# Patient Record
Sex: Male | Born: 1990 | Race: White | Hispanic: No | Marital: Married | Smoking: Never smoker
Health system: Southern US, Community
[De-identification: ages and names within clinical notes are randomized; demographics above are authoritative.]

## PROBLEM LIST (undated history)

## (undated) HISTORY — PX: TONSILLECTOMY: SUR1361

## (undated) HISTORY — PX: SURGERY SCROTAL / TESTICULAR: SUR1316

---

## 2019-02-06 ENCOUNTER — Emergency Department (HOSPITAL_COMMUNITY)
Admission: EM | Admit: 2019-02-06 | Discharge: 2019-02-06 | Disposition: A | Discharge status: Home / Self Care | Payer: BC Managed Care – PPO | Attending: Emergency Medicine | Admitting: Emergency Medicine

## 2019-02-06 ENCOUNTER — Ambulatory Visit (INDEPENDENT_AMBULATORY_CARE_PROVIDER_SITE_OTHER)
Admission: EM | Admit: 2019-02-06 | Discharge: 2019-02-06 | Disposition: A | Discharge status: Home / Self Care | Payer: BC Managed Care – PPO | Source: Home / Self Care | Attending: Internal Medicine | Admitting: Internal Medicine

## 2019-02-06 ENCOUNTER — Emergency Department (HOSPITAL_COMMUNITY): Payer: BC Managed Care – PPO

## 2019-02-06 ENCOUNTER — Encounter (HOSPITAL_COMMUNITY): Payer: Self-pay

## 2019-02-06 ENCOUNTER — Other Ambulatory Visit: Payer: Self-pay

## 2019-02-06 ENCOUNTER — Encounter (HOSPITAL_COMMUNITY): Payer: Self-pay | Admitting: Emergency Medicine

## 2019-02-06 ENCOUNTER — Inpatient Hospital Stay (HOSPITAL_COMMUNITY): Admit: 2019-02-06 | Payer: BC Managed Care – PPO

## 2019-02-06 DIAGNOSIS — N50812 Left testicular pain: Secondary | ICD-10-CM | POA: Diagnosis not present

## 2019-02-06 DIAGNOSIS — N50819 Testicular pain, unspecified: Secondary | ICD-10-CM

## 2019-02-06 LAB — POCT URINALYSIS DIP (DEVICE)
Bilirubin Urine: NEGATIVE
Glucose, UA: NEGATIVE mg/dL
Hgb urine dipstick: NEGATIVE
Ketones, ur: NEGATIVE mg/dL
Leukocytes,Ua: NEGATIVE
Nitrite: NEGATIVE
Protein, ur: NEGATIVE mg/dL
Specific Gravity, Urine: 1.025 (ref 1.005–1.030)
Urobilinogen, UA: 0.2 mg/dL (ref 0.0–1.0)
pH: 5 (ref 5.0–8.0)

## 2019-02-06 NOTE — ED Provider Notes (Signed)
10:31 PM handoff from [facility] at shift change.  Patient from urgent care with complaint of left testicular pain.  Symptoms rule out torsion.  Ultrasound is reassuring without any acute abnormalities.  Patient updated.  He states that overall the pain is gradually improving.  He seems relieved that he is not having testicular torsion.  He agrees to continue supportive measures at home including Tylenol, NSAIDs, scrotal support.  Urology information given in case patient has pain is not improving.    Carlisle Cater, PA-C 02/06/19 2232    Dorie Rank, MD 02/08/19 [phone]

## 2019-02-06 NOTE — Discharge Instructions (Signed)
Your ultrasound today did not show any serious problems with the testicle or scrotum.  No evidence of torsion, swelling or infection, varicose veins.  Please continue over-the-counter medications such as Tylenol or Motrin as needed for pain.  Follow-up with your primary care doctor or the urologist listed as needed if symptoms do not improve.

## 2019-02-06 NOTE — ED Notes (Signed)
Patient verbalizes understanding of discharge instructions. Opportunity for questioning and answers were provided. Armband removed by staff, pt discharged from ED.  

## 2019-02-06 NOTE — ED Provider Notes (Signed)
[facility]    CSN: [id number] Arrival date & [id number]: 02/06/19  1810     History   Chief Complaint Chief Complaint  Patient presents with   Testicle Pain    HPI Lawrence Armstrong is a 28 y.o. male with no past medical history comes to urgent care with complaints of sudden onset left testicular pain in the early hours of today.  Patient says he was asleep when the pain started.  Pain was severe, 10/10 and sharp in nature.  Pain was nonradiating into the groin or lower abdomen.  No known relieving factors.  Pain is improved and is currently 2 out of 10.  No dysuria, frequency or urgency.  No genital ulcerations.Marland Kitchen.   HPI  History reviewed. No pertinent past medical history.  There are no active problems to display for this patient.   History reviewed. No pertinent surgical history.     Home Medications    Prior to Admission medications   Not on File    Family History History reviewed. No pertinent family history.  Social History Social History   Tobacco Use   Smoking status: Never Smoker   Smokeless tobacco: Never Used  Substance Use Topics   Alcohol use: Yes    Comment: casual    Drug use: Never     Allergies   Patient has no known allergies.   Review of Systems Review of Systems  Constitutional: Negative for activity change and chills.  HENT: Negative.   Gastrointestinal: Negative.  Negative for nausea and vomiting.  Genitourinary: Positive for testicular pain. Negative for discharge, dysuria, flank pain, frequency, genital sores, penile pain, penile swelling, scrotal swelling and urgency.  Musculoskeletal: Negative.   Skin: Negative for rash and wound.  Neurological: Negative for dizziness, weakness, light-headedness and headaches.     Physical Exam Triage Vital Signs ED Triage Vitals  Enc Vitals Group     BP 02/06/19 1847 113/81     Pulse Rate 02/06/19 1847 66     Resp 02/06/19 1847 16     Temp 02/06/19 1847 98.5 F (36.9 C)     Temp Source 02/06/19 1847 Temporal     SpO2 02/06/19 1847 100 %     Weight --      Height --      Head Circumference --      Peak Flow --      Pain Score 02/06/19 1850 4     Pain Loc --      Pain Edu? --      Excl. in GC? --    No data found.  Updated Vital Signs BP 113/81 (BP Location: Left Arm)    Pulse 66    Temp 98.5 F (36.9 C) (Temporal)    Resp 16    SpO2 100%   Visual Acuity Right Eye Distance:   Left Eye Distance:   Bilateral Distance:    Right Eye Near:   Left Eye Near:    Bilateral Near:     Physical Exam Constitutional:      General: He is not in acute distress.    Appearance: He is not ill-appearing.  Cardiovascular:     Rate and Rhythm: Normal rate and regular rhythm.     Pulses: Normal pulses.     Heart sounds: Normal heart sounds.  Pulmonary:     Effort: Pulmonary effort is normal.     Breath sounds: Normal breath sounds.  Abdominal:     General: Bowel sounds are normal.  There is no distension.     Palpations: Abdomen is soft.     Tenderness: There is no abdominal tenderness. There is no guarding.     Hernia: No hernia is present.  Genitourinary:    Penis: Normal.      Comments: Left testicular tenderness over the epididymis.  No masses palpated in the testis bilaterally. Musculoskeletal: Normal range of motion.        General: No swelling or deformity.  Skin:    General: Skin is warm.     Capillary Refill: Capillary refill takes less than 2 seconds.     Coloration: Skin is not jaundiced.     Findings: No bruising or lesion.  Neurological:     General: No focal deficit present.     Mental Status: He is alert and oriented to person, place, and time.      UC Treatments / Results  Labs (all labs ordered are listed, but only abnormal results are displayed) Labs Reviewed  POCT URINALYSIS DIP (DEVICE)    EKG   Radiology US Scrotum W/doppler  Result Date: 02/06/2019 CLINICAL DATA:  28 year old male with left testicular pain. EXAM:  SCROTAL ULTRASOUND DOPPLER ULTRASOUND OF THE TESTICLES TECHNIQUE: Complete ultrasound examination of the testicles, epididymis, and other scrotal structures was performed. Color and spectral Doppler ultrasound were also utilized to evaluate blood flow to the testicles. COMPARISON:  None. FINDINGS: Right testicle Measurements: 3.8 x 1.7 x 3.0 cm for a volume of 10 cc. No mass or microlithiasis visualized. Left testicle Measurements: 4.4 x 2.0 x 3.0 cm for a volume of 13.5 cc. No mass or microlithiasis visualized. Right epididymis: Normal in size and appearance. There is a 5 mm right epididymal head cyst. Left epididymis:  Normal in size and appearance. Hydrocele:  None visualized. Varicocele:  None visualized. Pulsed Doppler interrogation of both testes demonstrates normal low resistance arterial and venous waveforms bilaterally. IMPRESSION: Unremarkable testicular ultrasound. Electronically Signed   By: Anner Crete M.D.   On: 02/06/2019 21:47    Procedures Procedures (including critical care time)  Medications Ordered in UC Medications - No data to display  Initial Impression / Assessment and Plan / UC Course  I have reviewed the triage vital signs and the nursing notes.  Pertinent labs & imaging results that were available during my care of the patient were reviewed by me and considered in my medical decision making (see chart for details).     1.  Left testicular pain, possible torsion: Ultrasound of the scrotum with Dopplers Over-the-counter pain medications If patient's symptoms recur he is advised to return to urgent care for reevaluation Urinalysis was unremarkable Final Clinical Impressions(s) / UC Diagnoses   Final diagnoses:  Testicle pain  Testicular pain, left   Discharge Instructions   None    ED Prescriptions    None     Controlled Substance Prescriptions [place] Controlled Substance Registry consulted? No   Chase Picket, MD 02/08/19 1426

## 2019-02-06 NOTE — ED Notes (Signed)
Called and left message with vascular to get scheduled for tomorrow

## 2019-02-06 NOTE — ED Provider Notes (Signed)
[name]   CSN: [id number] Arrival date & time: 02/06/19  1947    History   Chief Complaint Chief Complaint  Patient presents with  . Testicle Pain    HPI Lawrence Armstrong is a 28 y.o. male.     28 year old male presents with complaint of pain in his left testicle.  Patient states pain onset around 1 AM, states he was awake at that time.  Patient describes pain as sharp and intermittent at onset.  Patient reports ongoing pain at this time but not as severe as it was earlier.  Denies dysuria, urethral discharge, changes in bowel or bladder habits.  Patient is sexually active with his fiance, no new partners or concerns for STDs.     History reviewed. No pertinent past medical history.  There are no active problems to display for this patient.   History reviewed. No pertinent surgical history.      Home Medications    Prior to Admission medications   Not on File    Family History No family history on file.  Social History Social History   Tobacco Use  . Smoking status: Never Smoker  . Smokeless tobacco: Never Used  Substance Use Topics  . Alcohol use: Yes    Comment: casual   . Drug use: Never     Allergies   Patient has no known allergies.   Review of Systems Review of Systems  Constitutional: Negative for fever.  Gastrointestinal: Negative for abdominal pain, nausea and vomiting.  Genitourinary: Positive for testicular pain. Negative for discharge, dysuria, frequency, penile pain, penile swelling, scrotal swelling and urgency.  Skin: Negative for color change, rash and wound.  Allergic/Immunologic: Negative for immunocompromised state.  Neurological: Negative for weakness.  Psychiatric/Behavioral: Negative for confusion.  All other systems reviewed and are negative.    Physical Exam Updated Vital Signs There were no vitals taken for this visit.  Physical Exam Vitals signs and  nursing note reviewed. Exam conducted with a chaperone present.  Constitutional:      General: He is not in acute distress.    Appearance: He is well-developed. He is not diaphoretic.  HENT:     Head: Normocephalic and atraumatic.  Pulmonary:     Effort: Pulmonary effort is normal.  Abdominal:     Tenderness: There is no abdominal tenderness.  Genitourinary:    Penis: Normal and circumcised.      Scrotum/Testes:        Right: Mass, tenderness, swelling, testicular hydrocele or varicocele not present.        Left: Tenderness and varicocele present. Mass or swelling not present.     Comments: Question varicocele to left testicle, mild tenderness  Skin:    General: Skin is warm and dry.     Findings: No erythema or rash.  Neurological:     Mental Status: He is alert and oriented to person, place, and time.  Psychiatric:        Behavior: Behavior normal.      ED Treatments / Results  Labs (all labs ordered are listed, but only abnormal results are displayed) Labs Reviewed - No data to display  EKG None  Radiology No results found.  Procedures Procedures (including critical care time)  Medications Ordered in ED Medications - No data to display   Initial Impression / Assessment and Plan / ED Course  I have reviewed the triage vital signs and the nursing notes.  Pertinent labs & imaging  results that were available during my care of the patient were reviewed by me and considered in my medical decision making (see chart for details).  Clinical Course as of Feb 05 2038  Mon Feb 05, 8261  948 28 year old male with left testicular pain, sent by urgent care.  On exam has mild tenderness left testicle question varicocele.  Care signed out to [facility], PA-C, awaiting [place] results.    [LM]    Clinical Course User Index [LM] Tacy Learn, PA-C      Final Clinical Impressions(s) / ED Diagnoses   Final diagnoses:  Testicular pain    ED Discharge Orders    None        Roque Lias 02/06/19 2039    Dorie Rank, MD 02/08/19 [phone]

## 2019-02-06 NOTE — ED Triage Notes (Signed)
Pt here for left testicle pain starting last night that is improved today but not resolved

## 2019-02-06 NOTE — ED Triage Notes (Signed)
Pt from home w/ a c/o left testicular pain that began last night. He reports several episodes of intense pain, and a current complaint of discomfort. 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 No recent injuries or illnesses. No N/V/D. No dysuria or discharges noted.

## 2020-12-27 ENCOUNTER — Telehealth (HOSPITAL_COMMUNITY): Payer: Self-pay | Admitting: Physician Assistant

## 2020-12-27 ENCOUNTER — Other Ambulatory Visit: Payer: Self-pay

## 2020-12-27 ENCOUNTER — Ambulatory Visit (HOSPITAL_COMMUNITY)
Admission: EM | Admit: 2020-12-27 | Discharge: 2020-12-27 | Disposition: A | Discharge status: Home / Self Care | Payer: BC Managed Care – PPO | Attending: Physician Assistant | Admitting: Physician Assistant

## 2020-12-27 ENCOUNTER — Encounter (HOSPITAL_COMMUNITY): Payer: Self-pay | Admitting: Emergency Medicine

## 2020-12-27 DIAGNOSIS — M79645 Pain in left finger(s): Secondary | ICD-10-CM | POA: Diagnosis not present

## 2020-12-27 DIAGNOSIS — M7989 Other specified soft tissue disorders: Secondary | ICD-10-CM | POA: Diagnosis not present

## 2020-12-27 DIAGNOSIS — Z23 Encounter for immunization: Secondary | ICD-10-CM | POA: Diagnosis not present

## 2020-12-27 DIAGNOSIS — W5501XA Bitten by cat, initial encounter: Secondary | ICD-10-CM

## 2020-12-27 MED ORDER — AMOXICILLIN-POT CLAVULANATE 875-125 MG PO TABS: 1.0000 | ORAL_TABLET | Freq: Two times a day (BID) | ORAL | 0 refills | Status: AC

## 2020-12-27 MED ORDER — TETANUS-DIPHTH-ACELL PERTUSSIS 5-2.5-18.5 LF-MCG/0.5 IM SUSY
0.5000 mL | PREFILLED_SYRINGE | Freq: Once | INTRAMUSCULAR | Status: AC
  Administered 2020-12-27: 0.5 mL via INTRAMUSCULAR

## 2020-12-27 MED ORDER — AMOXICILLIN-POT CLAVULANATE 875-125 MG PO TABS: 1.0000 | ORAL_TABLET | Freq: Two times a day (BID) | ORAL | 0 refills | Status: DC

## 2020-12-27 MED ORDER — TETANUS-DIPHTH-ACELL PERTUSSIS 5-2.5-18.5 LF-MCG/0.5 IM SUSY
PREFILLED_SYRINGE | INTRAMUSCULAR | Status: AC
  Filled 2020-12-27: qty 0.5

## 2020-12-27 NOTE — Discharge Instructions (Addendum)
Your tetanus was updated today.  Please start Augmentin twice daily for 10 days.  This can upset your stomach so take it with food.  If you develop any worsening symptoms including increased swelling, difficulty moving finger, numbness, tingling sensation you need to be reevaluated.

## 2020-12-27 NOTE — ED Triage Notes (Signed)
PT was bit by his own cat today. Cat is up to date on rabies vaccines.   Last TDAP was 10+ years ago.

## 2020-12-27 NOTE — ED Provider Notes (Signed)
[facility]    CSN: [id number] Arrival date & time: 12/27/20  1712      History   Chief Complaint Chief Complaint  Patient presents with   Animal Bite    Cat bite to left pointer finger    HPI Lawrence Armstrong is a 30 y.o. male.   Patient presents today with a several hour history of left finger pain and swelling following animal bite.  Reports that he was trying to contain his cat when the cat bit his left pointer finger.  Patient reports pain is rated 2/3 on a 0-10 pain scale, localized to left proximal index finger, described as throbbing, no aggravating relieving factors identified.  He denies any numbness or paresthesias.  Reports that the cat is up-to-date on all vaccinations including rabies.  Patient is unsure when his last tetanus was.  He did clean affected area with warm water and soap prior to coming to clinic.  He denies any active bleeding or drainage.   History reviewed. No pertinent past medical history.  There are no problems to display for this patient.   History reviewed. No pertinent surgical history.     Home Medications    Prior to Admission medications   Medication Sig Start Date End Date Taking? Authorizing Provider  amoxicillin-clavulanate (AUGMENTIN) 875-125 MG tablet Take 1 tablet by mouth every 12 (twelve) hours for 10 days. 12/27/20 01/06/21 Yes Edwar Coe, Noberto Retort, PA-C    Family History History reviewed. No pertinent family history.  Social History Social History   Tobacco Use   Smoking status: Never   Smokeless tobacco: Never  Vaping Use   Vaping Use: Some days  Substance Use Topics   Alcohol use: Yes    Comment: casual    Drug use: Never     Allergies   Patient has no known allergies.   Review of Systems Review of Systems  Constitutional:  Negative for activity change, appetite change, fatigue and fever.  Respiratory:  Negative for cough and shortness of breath.   Cardiovascular:  Negative for chest pain.   Gastrointestinal:  Negative for abdominal pain, diarrhea, nausea and vomiting.  Musculoskeletal:  Positive for arthralgias and joint swelling.  Skin:  Positive for wound. Negative for color change.  Neurological:  Negative for dizziness, weakness, light-headedness, numbness and headaches.    Physical Exam Triage Vital Signs ED Triage Vitals  Enc Vitals Group     BP 12/27/20 1812 133/82     Pulse Rate 12/27/20 1812 69     Resp 12/27/20 1812 16     Temp 12/27/20 1812 98.8 F (37.1 C)     Temp Source 12/27/20 1812 Oral     SpO2 12/27/20 1812 100 %     Weight --      Height --      Head Circumference --      Peak Flow --      Pain Score 12/27/20 1814 4     Pain Loc --      Pain Edu? --      Excl. in GC? --    No data found.  Updated Vital Signs BP 133/82   Pulse 69   Temp 98.8 F (37.1 C) (Oral)   Resp 16   SpO2 100%   Visual Acuity Right Eye Distance:   Left Eye Distance:   Bilateral Distance:    Right Eye Near:   Left Eye Near:    Bilateral Near:     Physical Exam Vitals  reviewed.  Constitutional:      General: He is awake.     Appearance: Normal appearance. He is normal weight. He is not ill-appearing.     Comments: Very pleasant male appears stated age in no acute distress sitting comfortably in exam room  HENT:     Head: Normocephalic and atraumatic.  Cardiovascular:     Rate and Rhythm: Normal rate and regular rhythm.     Pulses:          Radial pulses are 2+ on the right side and 2+ on the left side.     Heart sounds: Normal heart sounds, S1 normal and S2 normal. No murmur heard.    Comments: Capillary refill within 2 seconds bilateral hands Pulmonary:     Effort: Pulmonary effort is normal.     Breath sounds: Normal breath sounds. No stridor. No wheezing, rhonchi or rales.     Comments: Clear to auscultation bilaterally Musculoskeletal:     Left hand: Swelling present. No tenderness or bony tenderness. Decreased range of motion. There is no  disruption of two-point discrimination.     Comments: Left hand: Swelling and decreased range of motion with flexion of left index finger secondary to to swelling.  No significant tenderness palpation.  Hand neurovascularly intact.  Skin:    Comments: Multiple puncture wounds to left proximal index finger without bleeding or drainage.  Neurological:     Mental Status: He is alert.  Psychiatric:        Behavior: Behavior is cooperative.     UC Treatments / Results  Labs (all labs ordered are listed, but only abnormal results are displayed) Labs Reviewed - No data to display  EKG   Radiology No results found.  Procedures Procedures (including critical care time)  Medications Ordered in UC Medications  Tdap (BOOSTRIX) injection 0.5 mL (0.5 mLs Intramuscular Given 12/27/20 1838)    Initial Impression / Assessment and Plan / UC Course  I have reviewed the triage vital signs and the nursing notes.  Pertinent labs & imaging results that were available during my care of the patient were reviewed by me and considered in my medical decision making (see chart for details).      Tetanus vaccine was updated today.  Patient started on Augmentin given cat bite.  Discussed high likelihood of infection and concerned that palpable could penetrate tendon sheath so if he has any worsening symptoms he needs to go to the emergency room.  Discussed alarm symptoms that warrant emergent evaluation.  He can use over-the-counter analgesics as needed for pain relief.  Strict return precautions given to which patient expressed understanding.  Final Clinical Impressions(s) / UC Diagnoses   Final diagnoses:  Cat bite, initial encounter  Finger pain, left  Swelling of left index finger     Discharge Instructions      Your tetanus was updated today.  Please start Augmentin twice daily for 10 days.  This can upset your stomach so take it with food.  If you develop any worsening symptoms including  increased swelling, difficulty moving finger, numbness, tingling sensation you need to be reevaluated.     ED Prescriptions     Medication Sig Dispense Auth. Provider   amoxicillin-clavulanate (AUGMENTIN) 875-125 MG tablet Take 1 tablet by mouth every 12 (twelve) hours for 10 days. 20 tablet Osher Oettinger, Noberto Retort, PA-C      PDMP not reviewed this encounter.   Jeani Hawking, PA-C 12/27/20 1841

## 2021-01-04 IMAGING — US ULTRASOUND SCROTUM DOPPLER COMPLETE
1 series · 14 of 25 positions shown · non-contrast
Comparison: None.

CLINICAL DATA: 27-year-old male with left testicular pain.

EXAM:
SCROTAL ULTRASOUND
DOPPLER ULTRASOUND OF THE TESTICLES
TECHNIQUE: Complete ultrasound examination of the testicles, epididymis, and
other scrotal structures was performed. Color and spectral Doppler
ultrasound were also utilized to evaluate blood flow to the
testicles.

[Series 1: ultrasound scrotum doppler complete · 14 of 61 slices shown]
[im 1/61]
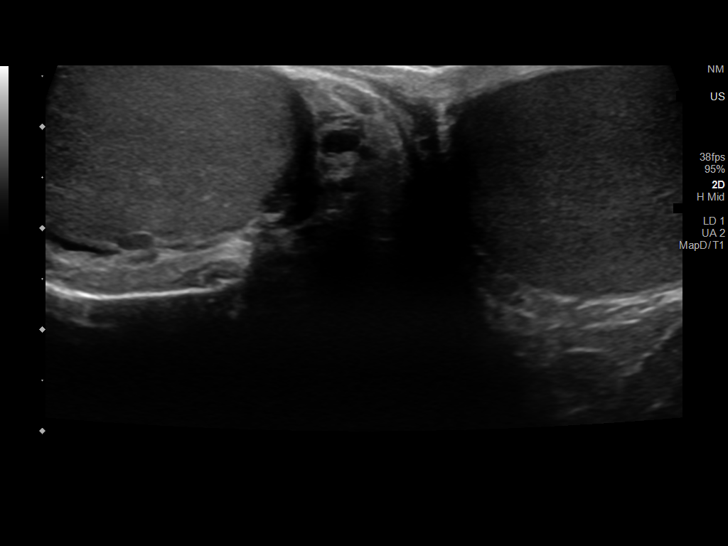
[im 6/61]
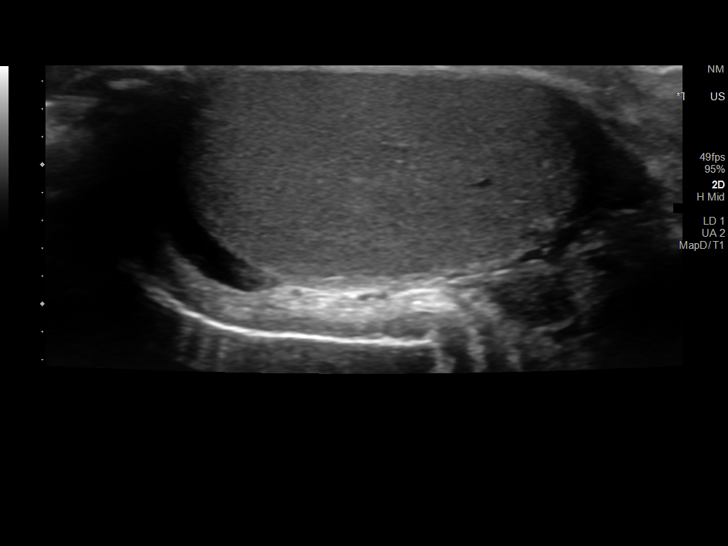
[im 11/61]
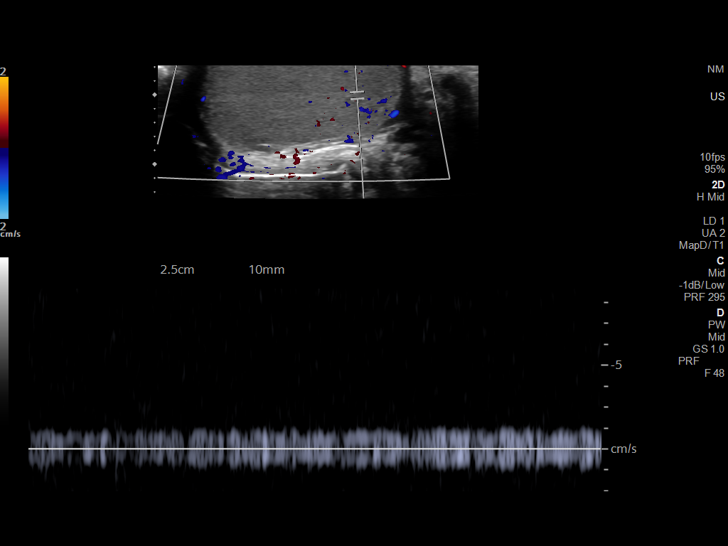
[im 16/61]
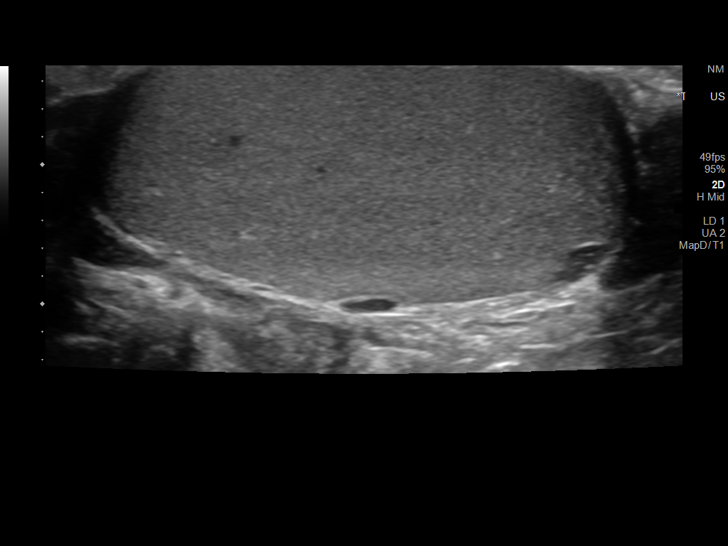
[im 21/61]
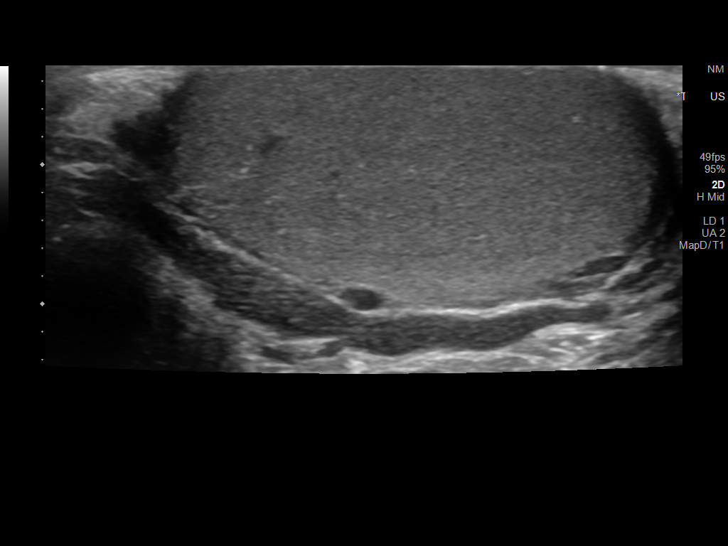
[im 23/61]
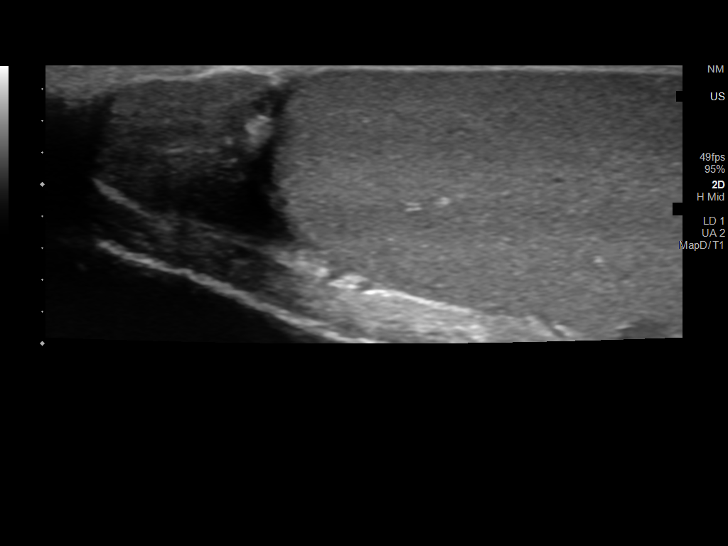
[im 28/61]
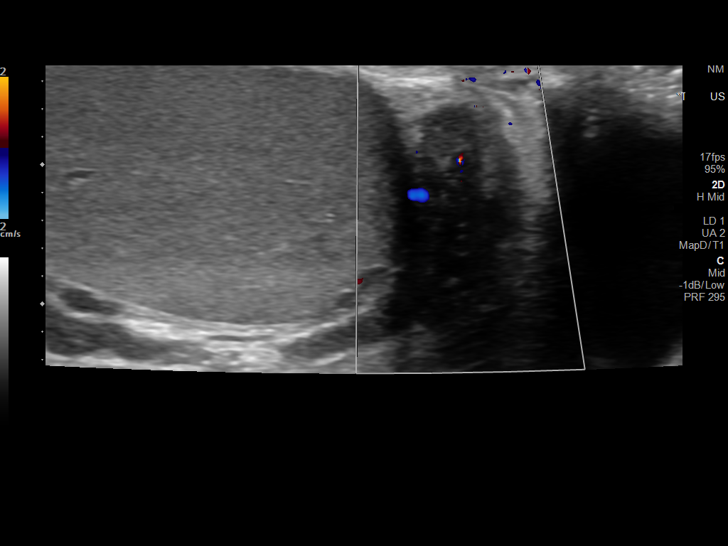
[im 33/61]
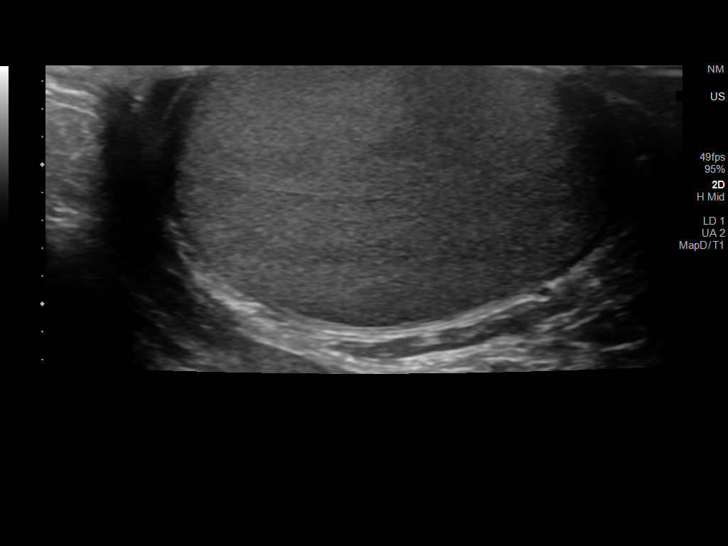
[im 38/61]
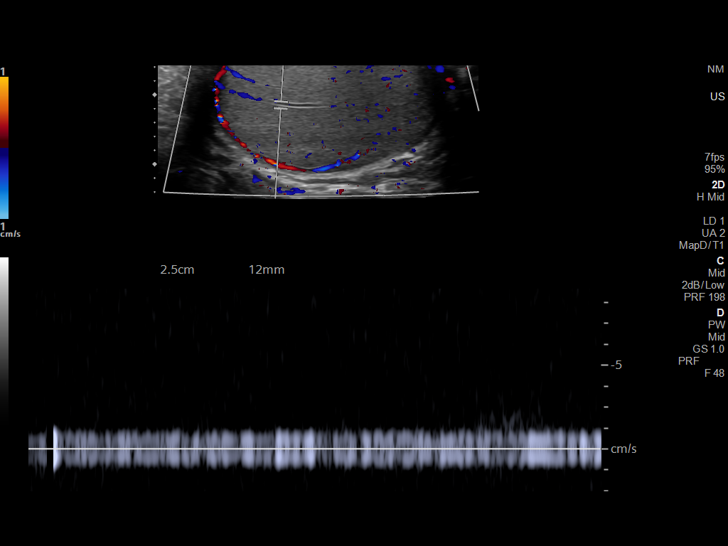
[im 41/61]
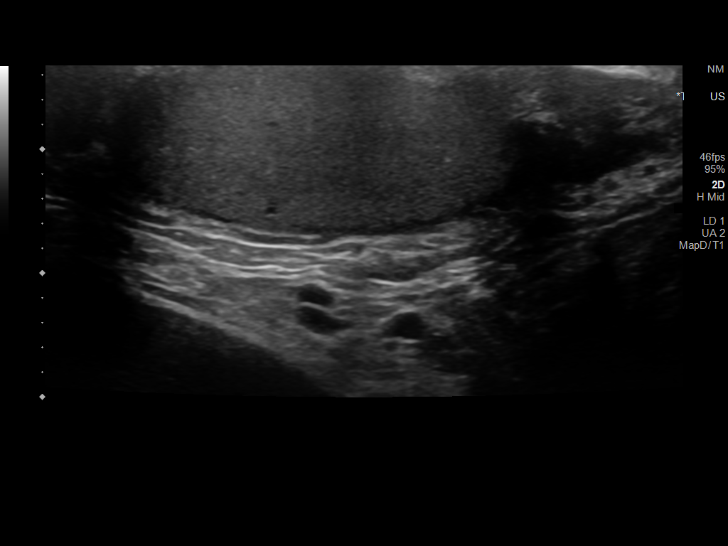
[im 46/61]
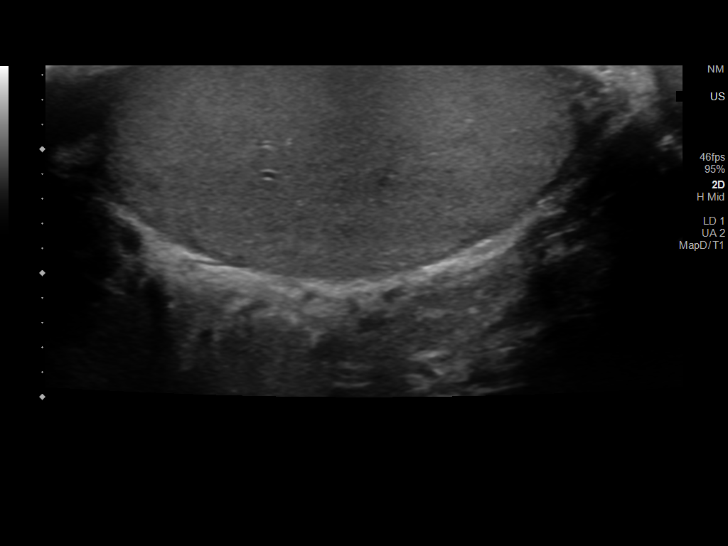
[im 51/61]
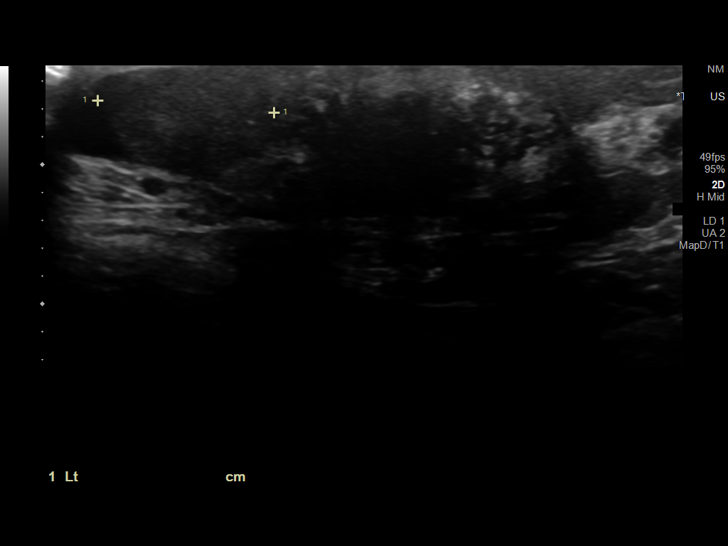
[im 56/61]
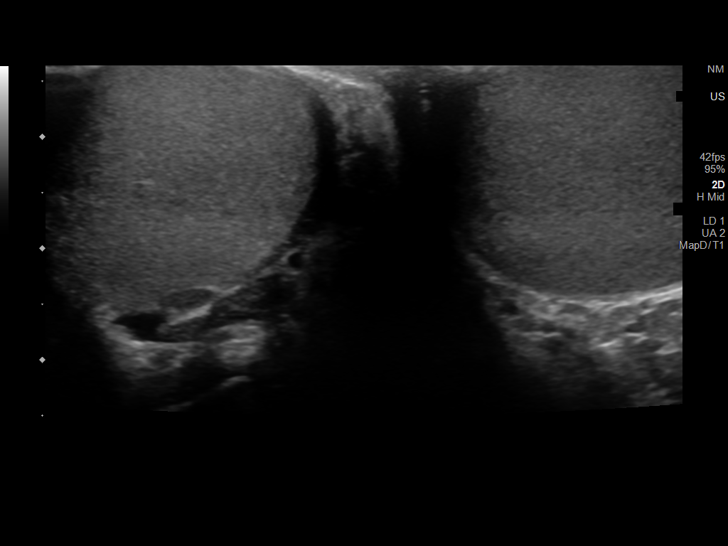
[im 61/61]
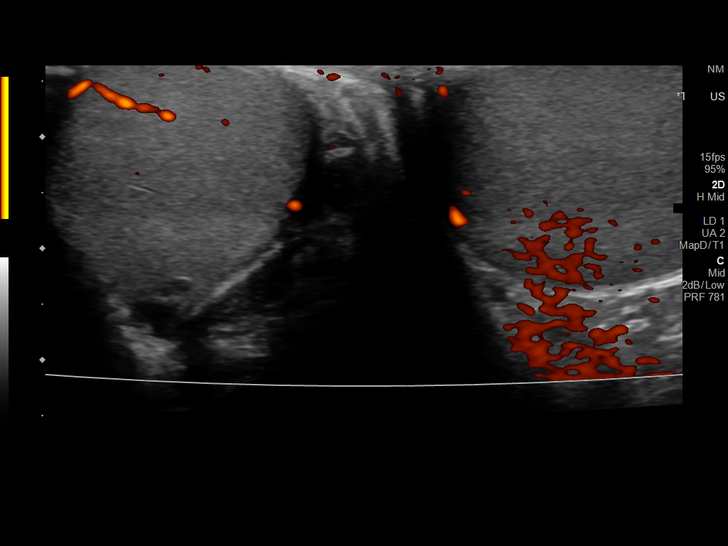

[14 of 25 positions shown; findings below may reference images not displayed]

FINDINGS: Right testicle

Measurements: 3.8 x 1.7 x 3.0 cm for a volume of 10 cc. No mass or
microlithiasis visualized.

Left testicle

Measurements: 4.4 x 2.0 x 3.0 cm for a volume of 13.5 cc. No mass or
microlithiasis visualized.

Right epididymis: Normal in size and appearance. There is a 5 mm
right epididymal head cyst.

Left epididymis:  Normal in size and appearance.

Hydrocele:  None visualized.

Varicocele:  None visualized.

Pulsed Doppler interrogation of both testes demonstrates normal low
resistance arterial and venous waveforms bilaterally.
IMPRESSION: Unremarkable testicular ultrasound.

## 2023-03-28 ENCOUNTER — Encounter: Payer: Self-pay | Admitting: Urgent Care

## 2023-03-28 ENCOUNTER — Ambulatory Visit
Admission: EM | Admit: 2023-03-28 | Discharge: 2023-03-28 | Disposition: A | Discharge status: Home / Self Care | Payer: 59 | Attending: Internal Medicine | Admitting: Internal Medicine

## 2023-03-28 DIAGNOSIS — L299 Pruritus, unspecified: Secondary | ICD-10-CM

## 2023-03-28 DIAGNOSIS — L249 Irritant contact dermatitis, unspecified cause: Secondary | ICD-10-CM

## 2023-03-28 MED ORDER — PREDNISONE 20 MG PO TABS: ORAL_TABLET | ORAL | 0 refills | Status: DC

## 2023-03-28 MED ORDER — HYDROXYZINE HCL 25 MG PO TABS: 12.5000 mg | ORAL_TABLET | Freq: Three times a day (TID) | ORAL | 0 refills | Status: DC | PRN

## 2023-03-28 NOTE — ED Provider Notes (Signed)
  [facility]  Note:  This document was prepared using Conservation officer, historic buildings and may include unintentional dictation errors.  MRN: [id number] DOB: 10-Dec-1990  Subjective:   Lawrence Armstrong is a 32 y.o. male presenting for 1.5 week history of persistent itchy rash over lower legs.  Symptoms started after he was doing a lot of yard work.  Denies any pain and has had mostly itching.  The rash is shown up over other parts of his legs but is worse over the left lower leg.  It is also manifested over the lower back.  No tenderness, drainage of pus or bleeding.  No current facility-administered medications for this encounter. No current outpatient medications on file.   No Known Allergies  History reviewed. No pertinent past medical history.   Past Surgical History:  Procedure Laterality Date   SURGERY SCROTAL / TESTICULAR     For undescended testicle   TONSILLECTOMY      History reviewed. No pertinent family history.  Social History   Tobacco Use   Smoking status: Never   Smokeless tobacco: Never  Vaping Use   Vaping status: Some Days  Substance Use Topics   Alcohol use: Yes    Comment: casual    Drug use: Yes    Types: Marijuana    ROS   Objective:   Vitals: BP 125/79 (BP Location: Right Arm)   Pulse 67   Temp 99.5 F (37.5 C) (Oral)   Resp 18   SpO2 98%   Physical Exam Constitutional:      General: He is not in acute distress.    Appearance: Normal appearance. He is well-developed and normal weight. He is not ill-appearing, toxic-appearing or diaphoretic.  HENT:     Head: Normocephalic and atraumatic.     Right Ear: External ear normal.     Left Ear: External ear normal.     Nose: Nose normal.     Mouth/Throat:     Pharynx: Oropharynx is clear.  Eyes:     General: No scleral icterus.       Right eye: No discharge.        Left eye: No discharge.     Extraocular Movements: Extraocular movements intact.   Cardiovascular:     Rate and Rhythm: Normal rate.  Pulmonary:     Effort: Pulmonary effort is normal.  Musculoskeletal:     Cervical back: Normal range of motion.  Skin:    Findings: Rash (large maculopapular patches with linear excoriations worse over the left lower leg, to lesser degree over the right lower leg as well and low back) present.  Neurological:     Mental Status: He is alert and oriented to person, place, and time.  Psychiatric:        Mood and Affect: Mood normal.        Behavior: Behavior normal.        Thought Content: Thought content normal.        Judgment: Judgment normal.     Assessment and Plan :   PDMP not reviewed this encounter.  1. Irritant dermatitis   2. Itching    There are no signs of secondary skin infection.  Recommend managing for irritant dermatitis with oral prednisone, hydroxyzine for itching.  Counseled patient on potential for adverse effects with medications prescribed/recommended today, ER and return-to-clinic precautions discussed, patient verbalized understanding.    Wallis Bamberg, PA-C 03/28/23 1400

## 2023-03-28 NOTE — ED Triage Notes (Signed)
Assessment per provider 

## 2023-10-13 ENCOUNTER — Ambulatory Visit
Admission: EM | Admit: 2023-10-13 | Discharge: 2023-10-13 | Disposition: A | Discharge status: Home / Self Care | Attending: Family Medicine | Admitting: Family Medicine

## 2023-10-13 DIAGNOSIS — L255 Unspecified contact dermatitis due to plants, except food: Secondary | ICD-10-CM | POA: Diagnosis not present

## 2023-10-13 MED ORDER — HYDROXYZINE HCL 25 MG PO TABS: 12.5000 mg | ORAL_TABLET | Freq: Three times a day (TID) | ORAL | 0 refills | Status: AC | PRN

## 2023-10-13 MED ORDER — PREDNISONE 20 MG PO TABS: ORAL_TABLET | ORAL | 0 refills | Status: AC

## 2023-10-13 NOTE — ED Triage Notes (Signed)
 Pt c/o scattered poison ivy x 5 days-NAD-steady gait

## 2023-10-13 NOTE — ED Provider Notes (Signed)
  [facility]  Note:  This document was prepared using Conservation officer, historic buildings and may include unintentional dictation errors.  MRN: [id number] DOB: August 06, 1990  Subjective:   Lawrence Armstrong is a 33 y.o. male presenting for 5-day history of acute onset of persistent itchy and stinging rash over the forearms and right torso.  Symptoms started after he was working in the yard and came into contact with poison ivy.  Has previously had significant reactions to this.  Did well with prednisone.  No fever, runny or stuffy nose, chest pain, shortness of breath, wheezing, nausea, vomiting, abdominal pain.  No chronic medications.  No Known Allergies  History reviewed. No pertinent past medical history.   Past Surgical History:  Procedure Laterality Date   SURGERY SCROTAL / TESTICULAR     For undescended testicle   TONSILLECTOMY      No family history on file.  Social History   Tobacco Use   Smoking status: Never   Smokeless tobacco: Never  Vaping Use   Vaping status: Former  Substance Use Topics   Alcohol use: Yes    Comment: occ   Drug use: Yes    Types: Marijuana    Comment: THC gummies    ROS   Objective:   Vitals: BP 115/72 (BP Location: Left Arm)   Pulse 62   Temp 98.3 F (36.8 C) (Oral)   Resp 16   SpO2 97%   Physical Exam Constitutional:      General: He is not in acute distress.    Appearance: Normal appearance. He is well-developed and normal weight. He is not ill-appearing, toxic-appearing or diaphoretic.  HENT:     Head: Normocephalic and atraumatic.     Right Ear: External ear normal.     Left Ear: External ear normal.     Nose: Nose normal.     Mouth/Throat:     Pharynx: Oropharynx is clear.  Eyes:     General: No scleral icterus.       Right eye: No discharge.        Left eye: No discharge.     Extraocular Movements: Extraocular movements intact.  Cardiovascular:     Rate and Rhythm: Normal rate.   Pulmonary:     Effort: Pulmonary effort is normal.  Musculoskeletal:     Cervical back: Normal range of motion.  Skin:    Findings: Rash (large patches of erythematic oozing lesions along the ventral and ulnar aspect of his forearms bilaterally, has similar lesions to a lesser degree over the right flank side) present.  Neurological:     Mental Status: He is alert and oriented to person, place, and time.  Psychiatric:        Mood and Affect: Mood normal.        Behavior: Behavior normal.        Thought Content: Thought content normal.        Judgment: Judgment normal.     Assessment and Plan :   PDMP not reviewed this encounter.  1. Rhus dermatitis    Recommended an oral prednisone course with hydroxyzine for his rest dermatitis.  Low suspicion for secondary cellulitis or herpes zoster.  Counseled patient on potential for adverse effects with medications prescribed/recommended today, ER and return-to-clinic precautions discussed, patient verbalized understanding.    Wallis Bamberg, [place] 10/13/23 [id number]
